# Patient Record
Sex: Female | Born: 1975 | Race: White | Marital: Married | Smoking: Never smoker
Health system: Southern US, Community
[De-identification: ages and names within clinical notes are randomized; demographics above are authoritative.]

## PROBLEM LIST (undated history)

## (undated) DIAGNOSIS — F32A Depression, unspecified: Secondary | ICD-10-CM

## (undated) DIAGNOSIS — F329 Major depressive disorder, single episode, unspecified: Secondary | ICD-10-CM

## (undated) DIAGNOSIS — E039 Hypothyroidism, unspecified: Secondary | ICD-10-CM

## (undated) HISTORY — PX: TONSILLECTOMY: SUR1361

## (undated) HISTORY — PX: MANDIBLE SURGERY: SHX707

## (undated) HISTORY — DX: Depression, unspecified: F32.A

## (undated) HISTORY — DX: Hypothyroidism, unspecified: E03.9

## (undated) HISTORY — DX: Major depressive disorder, single episode, unspecified: F32.9

---

## 2009-03-12 HISTORY — PX: LAPAROSCOPIC GASTRIC BANDING: SHX1100

## 2011-02-22 ENCOUNTER — Ambulatory Visit (INDEPENDENT_AMBULATORY_CARE_PROVIDER_SITE_OTHER): Payer: Managed Care, Other (non HMO) | Admitting: Family Medicine

## 2011-02-22 ENCOUNTER — Other Ambulatory Visit: Payer: Self-pay | Admitting: Family Medicine

## 2011-02-22 ENCOUNTER — Encounter: Payer: Self-pay | Admitting: Family Medicine

## 2011-02-22 ENCOUNTER — Other Ambulatory Visit (HOSPITAL_COMMUNITY)
Admission: RE | Admit: 2011-02-22 | Discharge: 2011-02-22 | Disposition: A | Discharge status: Home / Self Care | Payer: Managed Care, Other (non HMO) | Source: Ambulatory Visit | Attending: Family Medicine | Admitting: Family Medicine

## 2011-02-22 VITALS — BP 112/80 | HR 52 | Temp 98.7°F | Ht 63.0 in | Wt 153.5 lb

## 2011-02-22 DIAGNOSIS — Z136 Encounter for screening for cardiovascular disorders: Secondary | ICD-10-CM

## 2011-02-22 DIAGNOSIS — E039 Hypothyroidism, unspecified: Secondary | ICD-10-CM | POA: Insufficient documentation

## 2011-02-22 DIAGNOSIS — Z1231 Encounter for screening mammogram for malignant neoplasm of breast: Secondary | ICD-10-CM

## 2011-02-22 DIAGNOSIS — R5381 Other malaise: Secondary | ICD-10-CM

## 2011-02-22 DIAGNOSIS — F329 Major depressive disorder, single episode, unspecified: Secondary | ICD-10-CM | POA: Insufficient documentation

## 2011-02-22 DIAGNOSIS — Z0000 Encounter for general adult medical examination without abnormal findings: Secondary | ICD-10-CM

## 2011-02-22 DIAGNOSIS — R5383 Other fatigue: Secondary | ICD-10-CM | POA: Insufficient documentation

## 2011-02-22 DIAGNOSIS — Z01419 Encounter for gynecological examination (general) (routine) without abnormal findings: Secondary | ICD-10-CM | POA: Insufficient documentation

## 2011-02-22 DIAGNOSIS — Z1159 Encounter for screening for other viral diseases: Secondary | ICD-10-CM | POA: Insufficient documentation

## 2011-02-22 LAB — CBC WITH DIFFERENTIAL/PLATELET
Basophils Relative: 0.5 % (ref 0.0–3.0)
Eosinophils Relative: 2.4 % (ref 0.0–5.0)
HCT: 39.3 % (ref 36.0–46.0)
Lymphs Abs: 3.5 10*3/uL (ref 0.7–4.0)
MCV: 87.2 fl (ref 78.0–100.0)
Monocytes Absolute: 0.7 10*3/uL (ref 0.1–1.0)
Neutro Abs: 4.9 10*3/uL (ref 1.4–7.7)
RBC: 4.51 Mil/uL (ref 3.87–5.11)
WBC: 9.3 10*3/uL (ref 4.5–10.5)

## 2011-02-22 LAB — BASIC METABOLIC PANEL
Chloride: 103 mEq/L (ref 96–112)
Potassium: 3.8 mEq/L (ref 3.5–5.1)

## 2011-02-22 LAB — LIPID PANEL
LDL Cholesterol: 103 mg/dL — ABNORMAL HIGH (ref 0–99)
Total CHOL/HDL Ratio: 3

## 2011-02-22 LAB — VITAMIN B12: Vitamin B-12: 367 pg/mL (ref 211–911)

## 2011-02-22 NOTE — Progress Notes (Signed)
Subjective:    Patient ID: Paige Kirk, female    DOB: [birth year], 35 y.o.   MRN: [id number]  HPI  Very pleasant 35 yo female here to establish care and for CPX.  Recently moved back to [place] from [place].  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  G3P3- no h/o abnormal pap smears. Last past smear was a couple of years ago. Mom died of breast cancer at 49.  Pt has not had a mammogram in over 5 years. Has not noticed any breast masses or changes in her nipples.  Fatigue- the day they arrived here, son had a seizure for the first time, diagnosed with a brain tumor.  Has had two surgeries.  Obviously a stressful time for her but she wanted to make sure there was not more to her fatigue.  No CP or SOB.  Hair has been falling out more rapidly.  Hypothyroidism- has been on Synthroid for years, current dose for 3 years.  Has lost 70 pounds since her lap band surgery in 2011!  Feels great other than her fatigue.  Depression- Prozac 20 mg daily has been working well.  Although under stress, has supportive family. Not more tearful than expected. She is not having trouble sleeping- working nights. No SI or HI.  Patient Active Problem List  Diagnoses  . Hypothyroidism  . Fatigue  . Routine general medical examination at a health care facility  . Depression   Past Medical History  Diagnosis Date  . Hypothyroidism   . Depression    Past Surgical History  Procedure Date  . Laparoscopic gastric banding 2011  . Tonsillectomy   . Mandible surgery    History  Substance Use Topics  . Smoking status: Never Smoker   . Smokeless tobacco: Not on file  . Alcohol Use: Not on file   Family History  Problem Relation Age of Onset  . Cancer Mother   . Heart disease Father   . Hypertension Father    No Known Allergies No current outpatient prescriptions on file prior to visit.   The PMH, PSH, Social History, Family History, Medications, and allergies have been reviewed in [facility], and  have been updated if relevant.    Review of Systems See HPI Patient reports no  vision/ hearing changes,anorexia, weight change, fever ,adenopathy, persistant / recurrent hoarseness, swallowing issues, chest pain, edema,persistant / recurrent cough, hemoptysis, dyspnea(rest, exertional, paroxysmal nocturnal), gastrointestinal  bleeding (melena, rectal bleeding), abdominal pain, excessive heart burn, GU symptoms(dysuria, hematuria, pyuria, voiding/incontinence  Issues) syncope, focal weakness, severe memory loss, concerning skin lesions, depression, anxiety, abnormal bruising/bleeding, major joint swelling, breast masses or abnormal vaginal bleeding.       Objective:   Physical Exam BP 112/80  Pulse 52  Temp(Src) 98.7 F (37.1 C) (Oral)  Ht 5\' 3"  (1.6 m)  Wt 153 lb 8 oz (69.627 kg)  BMI 27.19 kg/m2  LMP 02/08/2011  General:  Well-developed,well-nourished,in no acute distress; alert,appropriate and cooperative throughout examination Head:  normocephalic and atraumatic.   Eyes:  vision grossly intact, pupils equal, pupils round, and pupils reactive to light.   Ears:  R ear normal and L ear normal.   Nose:  no external deformity.   Mouth:  good dentition.   Neck:  No deformities, masses, or tenderness noted. Breasts:  No mass, nodules, thickening, tenderness, bulging, retraction, inflamation, nipple discharge or skin changes noted.   Lungs:  Normal respiratory effort, chest expands symmetrically. Lungs are clear to auscultation, no crackles or wheezes. Heart:  Normal rate and regular rhythm. S1 and S2 normal without gallop, murmur, click, rub or other extra sounds. Abdomen:  Bowel sounds positive,abdomen soft and non-tender without masses, organomegaly or hernias noted. Rectal:  no external abnormalities.   Genitalia:  Pelvic Exam:        External: normal female genitalia without lesions or masses        Vagina: normal without lesions or masses        Cervix: normal without lesions or  masses        Adnexa: normal bimanual exam without masses or fullness        Uterus: normal by palpation        Pap smear: performed Msk:  No deformity or scoliosis noted of thoracic or lumbar spine.   Extremities:  No clubbing, cyanosis, edema, or deformity noted with normal full range of motion of all joints.   Neurologic:  alert & oriented X3 and gait normal.   Skin:  Intact without suspicious lesions or rashes, tattoo on lower back. Cervical Nodes:  No lymphadenopathy noted Axillary Nodes:  No palpable lymphadenopathy Psych:  Cognition and judgment appear intact. Alert and cooperative with normal attention span and concentration. No apparent delusions, illusions, hallucinations     Assessment & Plan:   1. Fatigue  Likely due to recent life changes and stressors- son's medical issues, working nights with recent move. Will check labs to rule out other contributing factors, particularly since she does have hypothyroidism and is s/p lap band which may affect her absorption of certain vit/min. The patient indicates understanding of these issues and agrees with the plan.  CBC w/Diff, B12, Vitamin D (25 hydroxy)  2. Routine general medical examination at a health care facility  Reviewed preventive care protocols, scheduled due services, and updated immunizations Discussed nutrition, exercise, diet, and healthy lifestyle.  Cytology -Pap Smear, Basic Metabolic Panel (BMET) MM Digital Screening Lipid Profile  3. Hypothyroidism  See #1. TSH, T4, free

## 2011-02-22 NOTE — Patient Instructions (Signed)
Wonderful to meet you. Have a wonderful christmas. Please stop by to see Shirlee Limerick after you go to the lab to set up your mammogram.

## 2011-02-23 ENCOUNTER — Telehealth: Payer: Self-pay | Admitting: *Deleted

## 2011-02-23 LAB — VITAMIN D 25 HYDROXY (VIT D DEFICIENCY, FRACTURES): Vit D, 25-Hydroxy: 26 ng/mL — ABNORMAL LOW (ref 30–89)

## 2011-02-23 MED ORDER — LEVOTHYROXINE SODIUM 112 MCG PO TABS: 112.0000 ug | ORAL_TABLET | Freq: Every day | ORAL | Status: DC

## 2011-02-23 MED ORDER — VITAMIN D3 1.25 MG (50000 UT) PO CAPS: 1.0000 | ORAL_CAPSULE | ORAL | Status: DC

## 2011-02-23 NOTE — Telephone Encounter (Signed)
Patient notified that Rx's were sent to [facility].

## 2011-03-01 ENCOUNTER — Encounter: Payer: Self-pay | Admitting: *Deleted

## 2011-04-12 ENCOUNTER — Ambulatory Visit: Payer: Self-pay | Admitting: Family Medicine

## 2011-04-18 ENCOUNTER — Encounter: Payer: Self-pay | Admitting: Family Medicine

## 2011-04-19 ENCOUNTER — Encounter: Payer: Self-pay | Admitting: *Deleted

## 2011-07-06 ENCOUNTER — Other Ambulatory Visit: Payer: Self-pay | Admitting: Family Medicine

## 2011-07-06 NOTE — Telephone Encounter (Signed)
Ok to refill the fluoxetine with 6 refills. Please call to ask pt what does of levothyroxine she is taking.

## 2011-07-06 NOTE — Telephone Encounter (Signed)
Our meds list says Levothyroxine 112 mcg.  Request is for 100 mcg.  Please advise.  Also request for Fluoxetine.  Electronic refill request.

## 2011-07-09 NOTE — Telephone Encounter (Signed)
Prozac sent to pharmacy.  Left message asking patient to call back regarding levothyroxine dose.

## 2011-07-10 NOTE — Telephone Encounter (Signed)
Spoke with patient, she says she doesn't use [facility], she uses [facility] and she has plenty of refills there.

## 2011-12-22 ENCOUNTER — Emergency Department: Payer: Self-pay | Admitting: Emergency Medicine

## 2011-12-22 LAB — URINALYSIS, COMPLETE
Bilirubin,UR: NEGATIVE
Blood: NEGATIVE
Glucose,UR: NEGATIVE mg/dL (ref 0–75)
Ketone: NEGATIVE
Leukocyte Esterase: NEGATIVE
Nitrite: NEGATIVE
Ph: 7 (ref 4.5–8.0)
Protein: NEGATIVE
RBC,UR: 2 /HPF (ref 0–5)
WBC UR: 1 /HPF (ref 0–5)

## 2011-12-22 LAB — CBC
HCT: 36.6 % (ref 35.0–47.0)
MCHC: 33.4 g/dL (ref 32.0–36.0)
MCV: 87 fL (ref 80–100)
Platelet: 269 10*3/uL (ref 150–440)
RBC: 4.21 10*6/uL (ref 3.80–5.20)
RDW: 14.1 % (ref 11.5–14.5)
WBC: 8.9 10*3/uL (ref 3.6–11.0)

## 2011-12-22 LAB — COMPREHENSIVE METABOLIC PANEL
Alkaline Phosphatase: 77 U/L (ref 50–136)
Anion Gap: 8 (ref 7–16)
Bilirubin,Total: 0.3 mg/dL (ref 0.2–1.0)
Chloride: 108 mmol/L — ABNORMAL HIGH (ref 98–107)
Co2: 27 mmol/L (ref 21–32)
Creatinine: 0.74 mg/dL (ref 0.60–1.30)
EGFR (African American): >60
EGFR (Non-African Amer.): >60
Osmolality: 284 (ref 275–301)
Potassium: 4.1 mmol/L (ref 3.5–5.1)
SGPT (ALT): 23 U/L (ref 12–78)
Sodium: 143 mmol/L (ref 136–145)

## 2012-01-10 ENCOUNTER — Ambulatory Visit: Payer: Self-pay | Admitting: Surgery

## 2012-03-27 ENCOUNTER — Encounter: Payer: Self-pay | Admitting: Family Medicine

## 2012-03-27 ENCOUNTER — Ambulatory Visit (INDEPENDENT_AMBULATORY_CARE_PROVIDER_SITE_OTHER): Payer: Managed Care, Other (non HMO) | Admitting: Family Medicine

## 2012-03-27 VITALS — BP 140/96 | HR 60 | Temp 98.0°F | Wt 150.0 lb

## 2012-03-27 DIAGNOSIS — R030 Elevated blood-pressure reading, without diagnosis of hypertension: Secondary | ICD-10-CM

## 2012-03-27 DIAGNOSIS — E039 Hypothyroidism, unspecified: Secondary | ICD-10-CM

## 2012-03-27 MED ORDER — FLUOXETINE HCL 20 MG PO CAPS: ORAL_CAPSULE | ORAL | Status: DC

## 2012-03-27 MED ORDER — LEVOTHYROXINE SODIUM 112 MCG PO TABS: 112.0000 ug | ORAL_TABLET | Freq: Every day | ORAL | Status: DC

## 2012-03-27 NOTE — Progress Notes (Signed)
Subjective:    Patient ID: Paige Kirk, female    DOB: 1975-09-27, 37 y.o.   MRN: [id number]  HPI  Very pleasant 37 yo female here with husband to discuss thyroid function.   Hypothyroidism- has been on Synthroid for years but stopped taking it two months ago because "she felt fine." Now very fatigued. Denies any changes in her bowel habits, hair or skin. No heat or cold intolerance.  ?HTN- her husband is concerned by her BP today.  States it was elevated at [facility] recently as well but he cannot remember reading. She did rush to get here and at a big breakfast.  With her h/o lap band, she gets abdominal discomfort when she does this which she is experiencing now.    Patient Active Problem List  Diagnosis  . Hypothyroidism  . Fatigue  . Routine general medical examination at a health care facility  . Depression   Past Medical History  Diagnosis Date  . Hypothyroidism   . Depression    Past Surgical History  Procedure Date  . Laparoscopic gastric banding 2011  . Tonsillectomy   . Mandible surgery    History  Substance Use Topics  . Smoking status: Never Smoker   . Smokeless tobacco: Not on file  . Alcohol Use: Not on file   Family History  Problem Relation Age of Onset  . Cancer Mother   . Heart disease Father   . Hypertension Father    No Known Allergies Current Outpatient Prescriptions on File Prior to Visit  Medication Sig Dispense Refill  . Cholecalciferol (VITAMIN D3) 50000 UNITS CAPS Take 1 capsule by mouth once a week.  6 capsule  0  . levothyroxine (SYNTHROID, LEVOTHROID) 112 MCG tablet Take 1 tablet (112 mcg total) by mouth daily.  30 tablet  3  . FLUoxetine (PROZAC) 20 MG capsule Take one by mouth daily.  90 capsule  0   The PMH, PSH, Social History, Family History, Medications, and allergies have been reviewed in [facility], and have been updated if relevant.    Review of Systems See HPI      Objective:   Physical Exam BP 140/96  Pulse 60  Temp  98 F (36.7 C)  Wt 150 lb (68.04 kg)  General:  Well-developed,well-nourished,in no acute distress; alert,appropriate and cooperative throughout examination Head:  normocephalic and atraumatic.   Eyes:  vision grossly intact, pupils equal, pupils round, and pupils reactive to light.   Ears:  R ear normal and L ear normal.   Nose:  no external deformity.   Mouth:  good dentition.   Neck:  No deformities, masses, or tenderness noted. General:  Well-developed,well-nourished,in no acute distress; alert,appropriate and cooperative throughout examination Head:  normocephalic and atraumatic.   Eyes:  vision grossly intact, pupils equal, pupils round, and pupils reactive to light.   Ears:  R ear normal and L ear normal.   Nose:  no external deformity.   Mouth:  good dentition.   Neck:  No deformities, masses, or tenderness noted. Lungs:  Normal respiratory effort, chest expands symmetrically. Lungs are clear to auscultation, no crackles or wheezes. Heart:  Normal rate and regular rhythm. S1 and S2 normal without gallop, murmur, click, rub or other extra sounds.     Assessment & Plan:   1. Hypothyroidism  TSH, T4, Free   Deteriorated.  Restart synthroid.  Recheck TSH/FT4 in 4 - 8 weeks. The patient indicates understanding of these issues and agrees with the plan.  2. Elevated blood pressure (not hypertension)     Bp normal at last office visit but that was over a year ago. BP Readings from Last 3 Encounters:  03/27/12 140/96  02/22/11 112/80   She is asymptomatic.  Advised getting home BP cuff, checking readings and calling me with results.  No rx. The patient indicates understanding of these issues and agrees with the plan.

## 2012-03-27 NOTE — Patient Instructions (Addendum)
Good to see you. If you are comfortable, buy a home blood pressure cuff--OMRON.  Please make an appointment to have your labs rechecked in 4-8 weeks.

## 2012-05-08 ENCOUNTER — Other Ambulatory Visit: Payer: Self-pay | Admitting: Family Medicine

## 2012-05-08 ENCOUNTER — Other Ambulatory Visit (INDEPENDENT_AMBULATORY_CARE_PROVIDER_SITE_OTHER): Payer: Managed Care, Other (non HMO)

## 2012-05-08 DIAGNOSIS — E039 Hypothyroidism, unspecified: Secondary | ICD-10-CM

## 2012-05-08 MED ORDER — LEVOTHYROXINE SODIUM 125 MCG PO TABS: 125.0000 ug | ORAL_TABLET | Freq: Every day | ORAL | Status: DC

## 2012-08-05 ENCOUNTER — Ambulatory Visit (INDEPENDENT_AMBULATORY_CARE_PROVIDER_SITE_OTHER): Payer: Managed Care, Other (non HMO) | Admitting: Family Medicine

## 2012-08-05 ENCOUNTER — Encounter: Payer: Self-pay | Admitting: Family Medicine

## 2012-08-05 VITALS — BP 120/72 | HR 54 | Temp 97.9°F | Ht 63.0 in | Wt 148.0 lb

## 2012-08-05 DIAGNOSIS — R197 Diarrhea, unspecified: Secondary | ICD-10-CM | POA: Insufficient documentation

## 2012-08-05 DIAGNOSIS — N760 Acute vaginitis: Secondary | ICD-10-CM

## 2012-08-05 LAB — BASIC METABOLIC PANEL
CO2: 28 mEq/L (ref 19–32)
Calcium: 9 mg/dL (ref 8.4–10.5)
Creatinine, Ser: 0.7 mg/dL (ref 0.4–1.2)
GFR: 95.96 mL/min (ref >60.00)
Sodium: 140 mEq/L (ref 135–145)

## 2012-08-05 MED ORDER — FLUCONAZOLE 150 MG PO TABS: 150.0000 mg | ORAL_TABLET | Freq: Once | ORAL | Status: DC

## 2012-08-05 NOTE — Progress Notes (Signed)
Subjective:    Patient ID: Paige Kirk, female    DOB: 06/23/1975, 37 y.o.   MRN: [id number]  HPI  37 yo healthy female here for:  1.  Diarrhea- ongoing for 4-6 weeks.  Started out with daily (sometimes only once a day), watery diarrhea.  Immodium helped.  Stopped taking the immodium a couple of weeks ago.  Now having watery BM every few days.  It is usually associated with abdominal cramping that is resolved by defacation.  No blood or mucous in stools.  No weight loss- she does have remove h/o lab band and cholecystectomy.  No fevers.    She has noted increased gassiness and bloating.  Has not noticed if any foods have made it worse.  Has not noticed if any foods have made it better or worse.  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  Legs have been cramping after tennis.  No family h/o IBD.  2.  Vaginal itching with white discharge- she has no risk factors for STDs and feels like yeast infection she has had in past.  Patient Active Problem List   Diagnosis Date Noted  . Diarrhea 08/05/2012  . Vaginitis and vulvovaginitis 08/05/2012  . Elevated blood pressure (not hypertension) 03/27/2012  . Hypothyroidism   . Depression    Past Medical History  Diagnosis Date  . Hypothyroidism   . Depression    Past Surgical History  Procedure Laterality Date  . Laparoscopic gastric banding  2011  . Tonsillectomy    . Mandible surgery     History  Substance Use Topics  . Smoking status: Never Smoker   . Smokeless tobacco: Not on file  . Alcohol Use: Not on file   Family History  Problem Relation Age of Onset  . Cancer Mother   . Heart disease Father   . Hypertension Father    No Known Allergies Current Outpatient Prescriptions on File Prior to Visit  Medication Sig Dispense Refill  . FLUoxetine (PROZAC) 20 MG capsule Take one by mouth daily.  90 capsule  0  . levothyroxine (SYNTHROID, LEVOTHROID) 125 MCG tablet Take 1 tablet (125 mcg total) by mouth daily.  30  tablet  3   No current facility-administered medications on file prior to visit.   The PMH, PSH, Social History, Family History, Medications, and allergies have been reviewed in [facility], and have been updated if relevant.  Review of Systems    See HPI Objective:   Physical Exam BP 120/72  Pulse 54  Temp(Src) 97.9 F (36.6 C) (Oral)  Ht 5\' 3"  (1.6 m)  Wt 148 lb (67.132 kg)  BMI 26.22 kg/m2  SpO2 99%  General:  Well-developed,well-nourished,in no acute distress; alert,appropriate and cooperative throughout examination Head:  normocephalic and atraumatic.   Abdomen:  Bowel sounds positive,abdomen soft and non-tender without masses, organomegaly or hernias noted. Left lower quadrant port palpable but non tender. Msk:  No deformity or scoliosis noted of thoracic or lumbar spine.   Extremities:  No clubbing, cyanosis, edema, or deformity noted with normal full range of motion of all joints.   Neurologic:  alert & oriented X3 and gait normal.   Skin:  Intact without suspicious lesions or rashes Psych:  Cognition and judgment appear intact. Alert and cooperative with normal attention span and concentration. No apparent delusions, illusions, hallucinations    Assessment & Plan:  1. Diarrhea New- seems to be improving in terms of frequency. 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  See AVS for instructions. - Stool culture - Clostridium difficile toxin - Basic Metabolic Panel  2. Vaginitis and vulvovaginitis Probable yeast infection.  Treat with diflucan 150 mg x 1.

## 2012-08-05 NOTE — Patient Instructions (Addendum)
  Please stop by the lab to get your stool sample kit.   pepcid 20 mg daily for next 2 weeks. Exclude gas producing foods (beans, onions, celery, carrots, raisins, bananas, apricots, prunes, brussel sprouts, wheat germ, pretzels)   Trial of align for bloating/IBS symptoms (OTC).  Take diflucan 150 mg by mouth x 1.

## 2012-08-10 LAB — STOOL CULTURE

## 2012-08-18 ENCOUNTER — Encounter: Payer: Self-pay | Admitting: *Deleted

## 2012-10-21 ENCOUNTER — Ambulatory Visit (INDEPENDENT_AMBULATORY_CARE_PROVIDER_SITE_OTHER): Payer: Managed Care, Other (non HMO) | Admitting: Family Medicine

## 2012-10-21 ENCOUNTER — Encounter: Payer: Self-pay | Admitting: Family Medicine

## 2012-10-21 VITALS — BP 100/70 | HR 60 | Temp 98.0°F | Ht 63.0 in | Wt 139.0 lb

## 2012-10-21 DIAGNOSIS — M7660 Achilles tendinitis, unspecified leg: Secondary | ICD-10-CM

## 2012-10-21 DIAGNOSIS — M7662 Achilles tendinitis, left leg: Secondary | ICD-10-CM

## 2012-10-21 NOTE — Progress Notes (Signed)
L heel pain.  Not getting better over the duration of symptoms. She hasn't stopped exercising.  She works at [employer]. More painful as the day goes on. Plays tennis. Going on for a few weeks.  No trauma. L achilles area is tender.  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  Takes ibuprofen occ, some help.  Alternates shoes at work.  At its worst, 5/10 pain.    Meds, vitals, and allergies reviewed.   ROS: See HPI.  Otherwise, noncontributory.  nad ncat L foot with normal inspection and DP pulse. Normal distal sensation.  L AT tender at the insertion but no failure on testing the calf.  Pain is improved in boot.

## 2012-10-21 NOTE — Patient Instructions (Signed)
Wear the boot as much as possible when weight bearing.  Recheck with Dr. Patsy Lager in about 2 weeks.  Get a heel lift in the meantime.  Take care.

## 2012-10-22 NOTE — Assessment & Plan Note (Signed)
L AT tender at the insertion but no failure on testing the calf.  Pain is improved in boot.  Likely quickest improvement with CAM boot, fitted in clinic.  Use as much as possible.  She'll f/u with Dr. Patsy Lager about future rehab of the AT before return to exercise.  She agrees.

## 2012-11-05 ENCOUNTER — Ambulatory Visit (INDEPENDENT_AMBULATORY_CARE_PROVIDER_SITE_OTHER): Payer: Managed Care, Other (non HMO) | Admitting: Family Medicine

## 2012-11-05 ENCOUNTER — Encounter: Payer: Self-pay | Admitting: Family Medicine

## 2012-11-05 VITALS — BP 112/80 | HR 61 | Temp 98.2°F | Ht 63.0 in | Wt 141.2 lb

## 2012-11-05 DIAGNOSIS — M7662 Achilles tendinitis, left leg: Secondary | ICD-10-CM

## 2012-11-05 DIAGNOSIS — M7660 Achilles tendinitis, unspecified leg: Secondary | ICD-10-CM

## 2012-11-05 NOTE — Patient Instructions (Addendum)
Achilles Rehab  Begin with easy walking, heel, toe and backwards  Calf raises on a step First lower and then raise on 1 foot If this is painful lower on 1 foot but do the heel raise on both feet  Begin with 3 sets of 10 repetitions  Increase by 5 repetitions every 3 days  Goal is 3 sets of 30 repetitions  Do with both knee straight and knee at 20 degrees of flexion  If pain persists at 3 sets of 30 - add backpack with 5 lbs Increase by 5 lbs per week to max of 30 lbs  

## 2012-11-05 NOTE — Progress Notes (Signed)
[facility] [address] [place] [address] Phone: [phone] Fax: [phone]  Date:  11/05/2012   Name:  Paige Kirk   DOB:  10-Aug-1975   MRN:  [id number] Gender: female Age: 37 y.o.  Primary Physician:  Ruthe Mannan, MD  Evaluating MD: Hannah Beat, MD   Chief Complaint: Foot Pain   History of Present Illness:  Paige Kirk is a 37 y.o. pleasant patient who presents with the following:  Achilles? Left: Saw Dr. Para March 2 weeks ago, and he felt she had L achilles tendinopathy and placed her in a CAM walker boot and d/c her physical activity. Now she is feeling quite a bit better. 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  10/22/2012 OV L heel pain.  Not getting better over the duration of symptoms. She hasn't stopped exercising.  She works at [employer]. More painful as the day goes on. Plays tennis. Going on for a few weeks.  No trauma. L achilles area is tender.  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  Takes ibuprofen occ, some help.  Alternates shoes at work.  At its worst, 5/10 pain.    Meds, vitals, and allergies reviewed.   ROS: See HPI.  Otherwise, noncontributory.  nad ncat L foot with normal inspection and DP pulse. Normal distal sensation.   L AT tender at the insertion but no failure on testing the calf.  Pain is improved in boot.     Patient Active Problem List   Diagnosis Date Noted  . Achilles tendonitis 10/21/2012  . Diarrhea 08/05/2012  . Vaginitis and vulvovaginitis 08/05/2012  . Elevated blood pressure (not hypertension) 03/27/2012  . Hypothyroidism   . Depression     Past Medical History  Diagnosis Date  . Hypothyroidism   . Depression     Past Surgical History  Procedure Laterality Date  . Laparoscopic gastric banding  2011  . Tonsillectomy    . Mandible surgery      History   Social History  . Marital Status: Married    Spouse Name: N/A    Number of Children: 3  . Years of Education:  N/A   Occupational History  . Respiratory therapist    Social History Main Topics  . Smoking status: Never Smoker   . Smokeless tobacco: Not on file  . Alcohol Use: No  . Drug Use: Not on file  . Sexual Activity: Not on file   Other Topics Concern  . Not on file   Social History Narrative  . No narrative on file    Family History  Problem Relation Age of Onset  . Cancer Mother   . Heart disease Father   . Hypertension Father     No Known Allergies  Medication list has been reviewed and updated.  Outpatient Prescriptions Prior to Visit  Medication Sig Dispense Refill  . famotidine (PEPCID) 10 MG tablet Take 10 mg by mouth 2 (two) times daily.      Marland Kitchen FLUoxetine (PROZAC) 20 MG capsule Take one by mouth daily.  90 capsule  0  . levothyroxine (SYNTHROID, LEVOTHROID) 125 MCG tablet Take 1 tablet (125 mcg total) by mouth daily.  30 tablet  3  . Probiotic Product (ALIGN PO) Take on by mouth daily       No facility-administered medications prior to visit.    Review of Systems:   GEN: No fevers, chills. Nontoxic. Primarily MSK c/o today. MSK: Detailed  in the HPI GI: tolerating PO intake without difficulty Neuro: No numbness, parasthesias, or tingling associated. Otherwise the pertinent positives of the ROS are noted above.    Physical Examination: BP 112/80  Pulse 61  Temp(Src) 98.2 F (36.8 C) (Oral)  Ht 5\' 3"  (1.6 m)  Wt 141 lb 4 oz (64.071 kg)  BMI 25.03 kg/m2  LMP 10/15/2012  Ideal Body Weight: Weight in (lb) to have BMI = 25: 140.8   GEN: Well-developed,well-nourished,in no acute distress; alert,appropriate and cooperative throughout examination HEENT: Normocephalic and atraumatic without obvious abnormalities. Ears, externally no deformities PULM: Breathing comfortably in no respiratory distress EXT: No clubbing, cyanosis, or edema PSYCH: Normally interactive. Cooperative during the interview. Pleasant. Friendly and conversant. Not anxious or depressed  appearing. Normal, full affect.  Foot: L Echymosis: no Edema: no ROM: full LE B Gait: heel toe, non-antalgic MT pain: no Callus pattern: none Lateral Mall: NT Medial Mall: NT Talus: NT Navicular: NT Cuboid: NT Calcaneous: NT Metatarsals: NT 5th MT: NT Phalanges: NT Achilles: PAINFUL TO PALPATE AT INSERTION ON LEFT, SMALL NODULE - but extremely small THOMPSON'S YIELDS PLANTARFLEXION Plantar Fascia: NT Fat Pad: NT Peroneals: NT Post Tib: NT Great Toe: Nml motion Ant Drawer: neg ATFL: NT CFL: NT Deltoid: NT Sensation: intact Cavus foot   Assessment and Plan:  Achilles tendonitis, left  >25 minutes spent in face to face time with patient, >50% spent in counselling or coordination of care  D/c CAM minimal use over next 3 days.  Given sports insoles.  Wear tennis shoes for now Suggested Tuli's heel cups  Reviewed Alfredsson's protocol. Refer to the patient instructions sections for details of plan shared with patient.   Orders Today:  No orders of the defined types were placed in this encounter.    Updated Medication List: (Includes new medications, updates to list, dose adjustments) Meds ordered this encounter  Medications  . ibuprofen (ADVIL) 200 MG tablet    Sig: Take 200 mg by mouth as needed for pain.    Medications Discontinued: There are no discontinued medications.    Signed, Elpidio Galea. Turner Baillie, MD 11/05/2012 9:15 AM

## 2013-01-15 ENCOUNTER — Other Ambulatory Visit: Payer: Self-pay

## 2013-04-21 ENCOUNTER — Encounter: Payer: Self-pay | Admitting: Family Medicine

## 2013-04-21 ENCOUNTER — Ambulatory Visit (INDEPENDENT_AMBULATORY_CARE_PROVIDER_SITE_OTHER): Payer: Managed Care, Other (non HMO) | Admitting: Family Medicine

## 2013-04-21 VITALS — BP 110/66 | HR 51 | Temp 98.0°F | Ht 63.5 in | Wt 149.0 lb

## 2013-04-21 DIAGNOSIS — F32A Depression, unspecified: Secondary | ICD-10-CM

## 2013-04-21 DIAGNOSIS — R030 Elevated blood-pressure reading, without diagnosis of hypertension: Secondary | ICD-10-CM

## 2013-04-21 DIAGNOSIS — F329 Major depressive disorder, single episode, unspecified: Secondary | ICD-10-CM

## 2013-04-21 DIAGNOSIS — F3289 Other specified depressive episodes: Secondary | ICD-10-CM

## 2013-04-21 DIAGNOSIS — E039 Hypothyroidism, unspecified: Secondary | ICD-10-CM

## 2013-04-21 MED ORDER — LEVOTHYROXINE SODIUM 125 MCG PO TABS: 125.0000 ug | ORAL_TABLET | Freq: Every day | ORAL | Status: AC

## 2013-04-21 MED ORDER — FLUOXETINE HCL 20 MG PO CAPS: ORAL_CAPSULE | ORAL | Status: AC

## 2013-04-21 NOTE — Progress Notes (Signed)
Subjective:    Patient ID: Paige Kirk, female    DOB: Jul 20, 1975, 38 y.o.   MRN: [id number]  HPI  Very pleasant 38 yo female here for med refills.  Hypothyroidism- was supposed to be taking synthroid 125 mcg daily.  Has not been taking it in at least a month.   Son had third brain tumor resection recently and she just stopped taking care of herself to focus on him.  Has noticed fatigue.  Hair is also falling out.     Denies any changes in her bowel habits. No heat or cold intolerance.  Overdue for labs.  Lab Results  Component Value Date   TSH 21.19* 05/08/2012    Depression- taking Prozac 20 mg daily.  Feels this works well when she takes it but has ran out a month ago.  She has been exercising and has great family support.  Denies feeling depressed or anxious.    Patient Active Problem List   Diagnosis Date Noted  . Hypothyroidism     Priority: High  . Depression     Priority: High  . Elevated blood pressure (not hypertension) 03/27/2012   Past Medical History  Diagnosis Date  . Hypothyroidism   . Depression    Past Surgical History  Procedure Laterality Date  . Laparoscopic gastric banding  2011  . Tonsillectomy    . Mandible surgery     History  Substance Use Topics  . Smoking status: Never Smoker   . Smokeless tobacco: Not on file  . Alcohol Use: No   Family History  Problem Relation Age of Onset  . Cancer Mother   . Heart disease Father   . Hypertension Father    No Known Allergies Current Outpatient Prescriptions on File Prior to Visit  Medication Sig Dispense Refill  . famotidine (PEPCID) 10 MG tablet Take 10 mg by mouth 2 (two) times daily.      Marland Kitchen ibuprofen (ADVIL) 200 MG tablet Take 200 mg by mouth as needed for pain.       No current facility-administered medications on file prior to visit.   The PMH, PSH, Social History, Family History, Medications, and allergies have been reviewed in [facility], and have been updated if relevant.    Review  of Systems See HPI  No SI or HI     Objective:   Physical Exam BP 110/66  Pulse 51  Temp(Src) 98 F (36.7 C) (Oral)  Ht 5' 3.5" (1.613 m)  Wt 149 lb (67.586 kg)  BMI 25.98 kg/m2  SpO2 99%  LMP 04/07/2013  General:  Well-developed,well-nourished,in no acute distress; alert,appropriate and cooperative throughout examination Head:  normocephalic and atraumatic.   Eyes:  vision grossly intact, pupils equal, pupils round, and pupils reactive to light.   Ears:  R ear normal and L ear normal.   Nose:  no external deformity.   Mouth:  good dentition.   Neck:  No deformities, masses, or tenderness noted. General:  Well-developed,well-nourished,in no acute distress; alert,appropriate and cooperative throughout examination Head:  normocephalic and atraumatic.   Eyes:  vision grossly intact, pupils equal, pupils round, and pupils reactive to light.   Ears:  R ear normal and L ear normal.   Nose:  no external deformity.   Mouth:  good dentition.   Neck:  No deformities, masses, or tenderness noted. Lungs:  Normal respiratory effort, chest expands symmetrically. Lungs are clear to auscultation, no crackles or wheezes. Heart:  Normal rate and regular rhythm. S1 and S2  normal without gallop, murmur, click, rub or other extra sounds.     Assessment & Plan:   Hypothyroidism  Elevated blood pressure (not hypertension)  Depression Deteriorated.  Restart synthroid.  Recheck TSH/FT4 in 4 - 8 weeks. The patient indicates understanding of these issues and agrees with the plan.

## 2013-04-21 NOTE — Assessment & Plan Note (Signed)
Deteriorated because she stopped taking her replacement therapy.  Rx refilled- she will restart this and return in 4 weeks to check labs. Orders Placed This Encounter  Procedures  . TSH  . T4, Free  . Basic metabolic panel

## 2013-04-21 NOTE — Assessment & Plan Note (Signed)
Rx refilled. Call or return to clinic prn if these symptoms worsen or fail to improve as anticipated. The patient indicates understanding of these issues and agrees with the plan.

## 2013-04-21 NOTE — Patient Instructions (Signed)
Hang in there. Please come back in 4 weeks to get your thyroid labs rechecked.

## 2013-04-21 NOTE — Progress Notes (Signed)
Pre-visit discussion using our clinic review tool. No additional management support is needed unless otherwise documented below in the visit note.  

## 2013-05-20 ENCOUNTER — Other Ambulatory Visit: Payer: Managed Care, Other (non HMO)

## 2013-11-27 IMAGING — US ABDOMEN ULTRASOUND LIMITED
1 series · 14 of 25 positions shown · non-contrast
Comparison: none

REASON FOR EXAM: EPIGASTRIC PAIN & VOMITING
COMMENTS:   Body Site: GB and Fossa, CBD, Head of Pancreas

PROCEDURE:     US  - US ABDOMEN LIMITED SURVEY  - December 22, 2011  [DATE]
RESULT:     Comparison: None.
TECHNIQUE: Multiple grayscale and color Doppler images were obtained the
right upper quadrant.

[Series 1: abdomen ultrasound limited · 0.26mm/px · 14 of 27 slices shown]
[im 1/27]
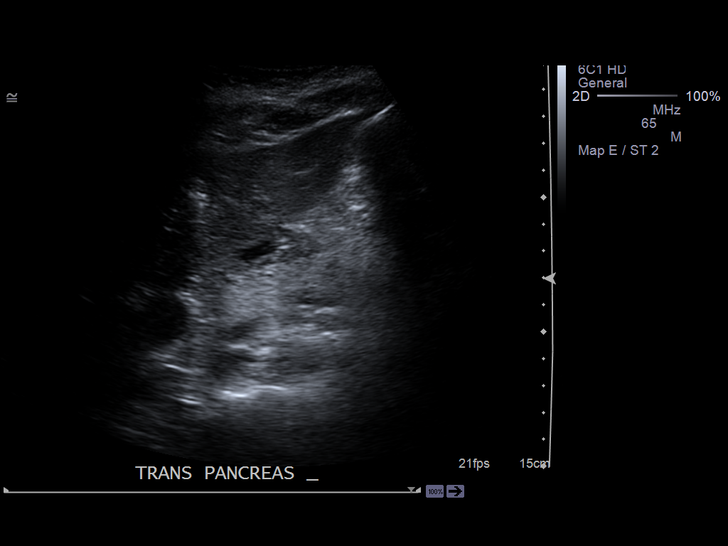
[im 3/27]
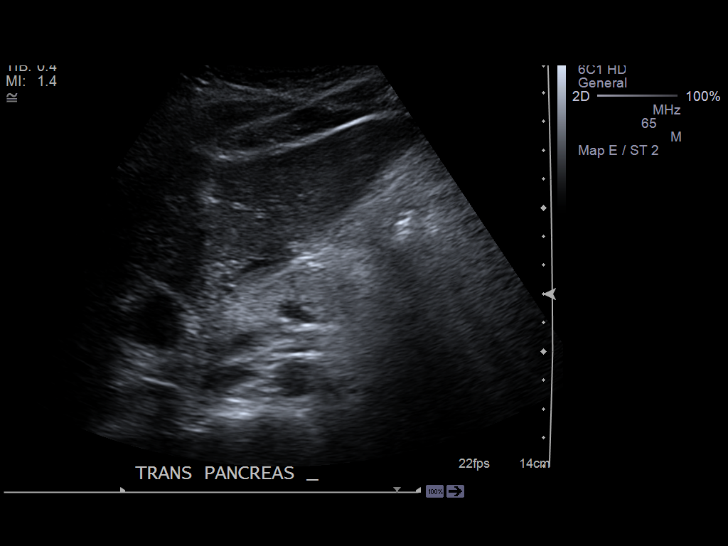
[im 5/27]
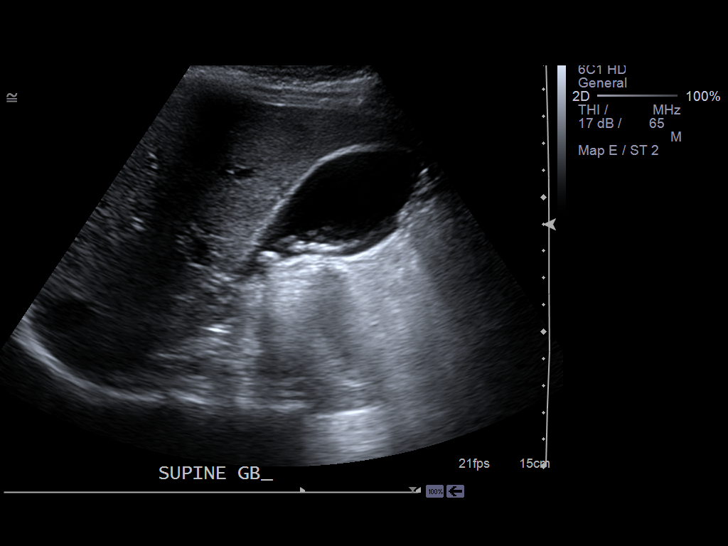
[im 7/27]
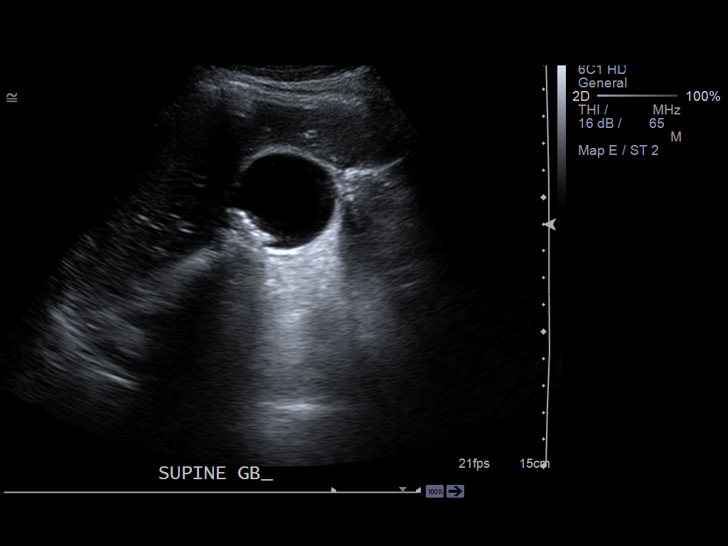
[im 9/27]
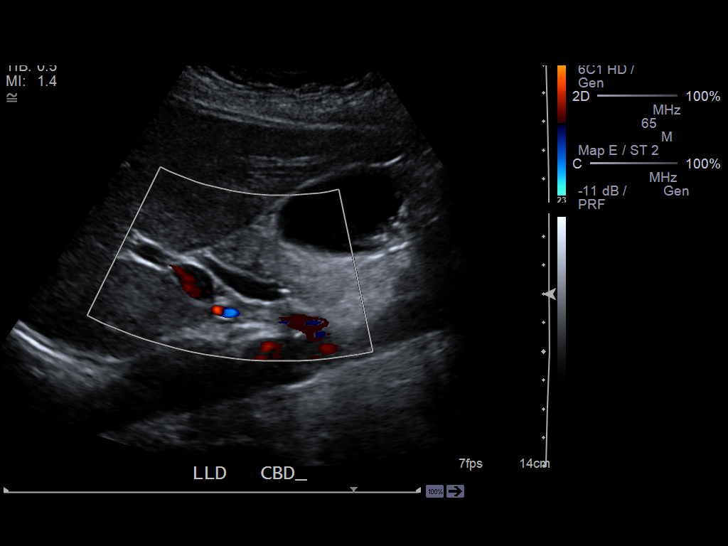
[im 10/27]
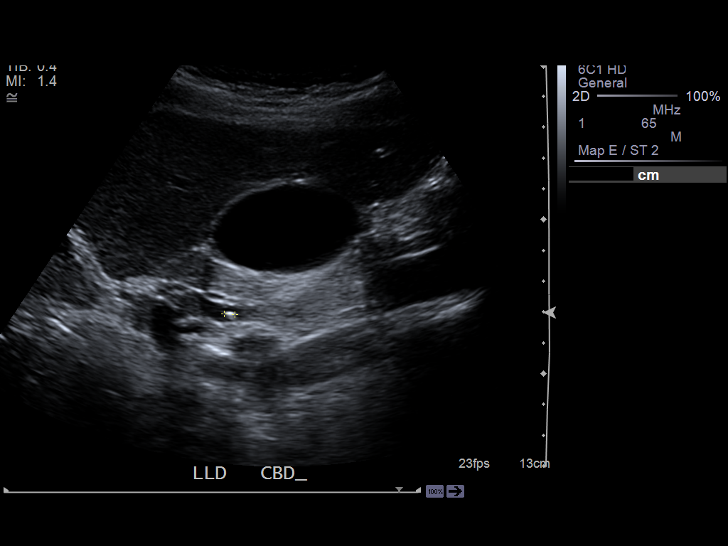
[im 12/27]
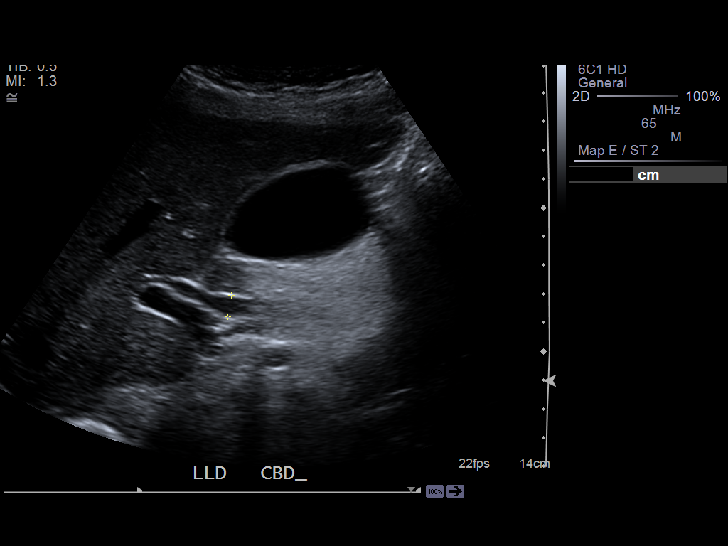
[im 15/27]
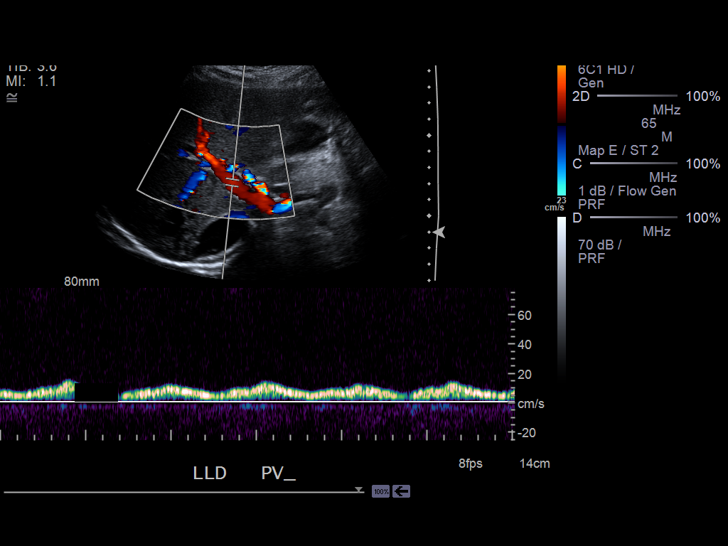
[im 17/27]
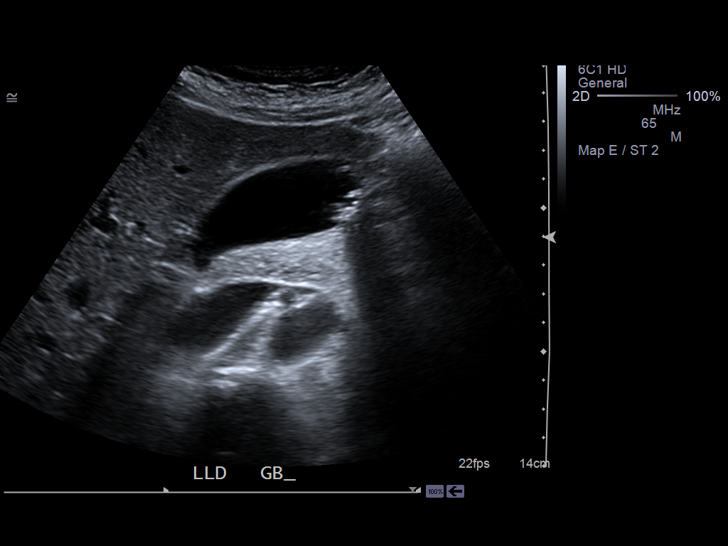
[im 18/27]
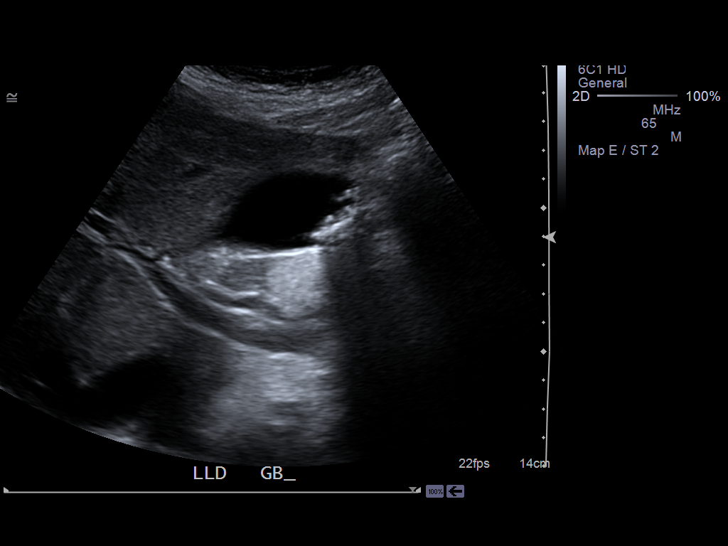
[im 20/27]
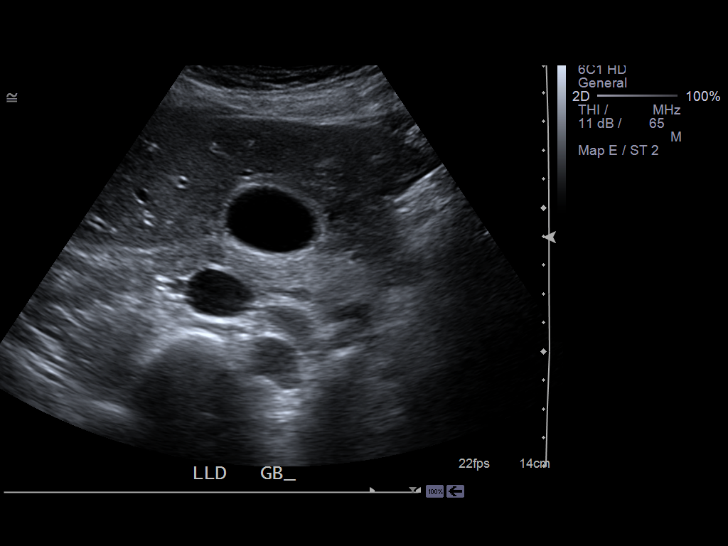
[im 22/27]
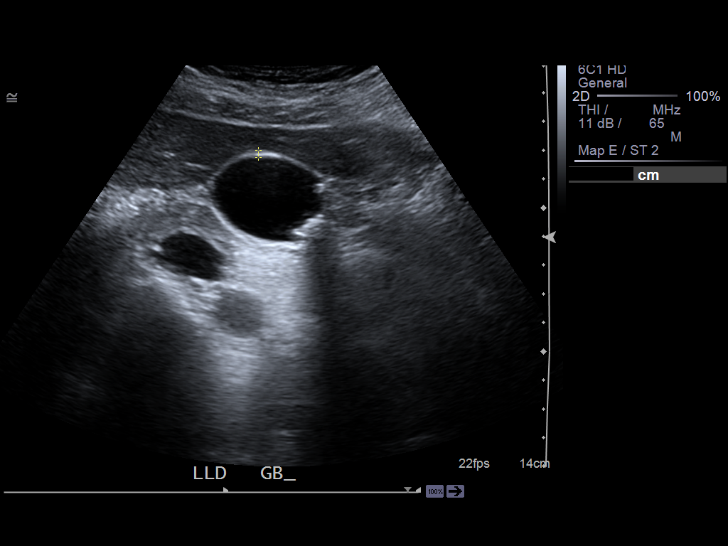
[im 24/27]
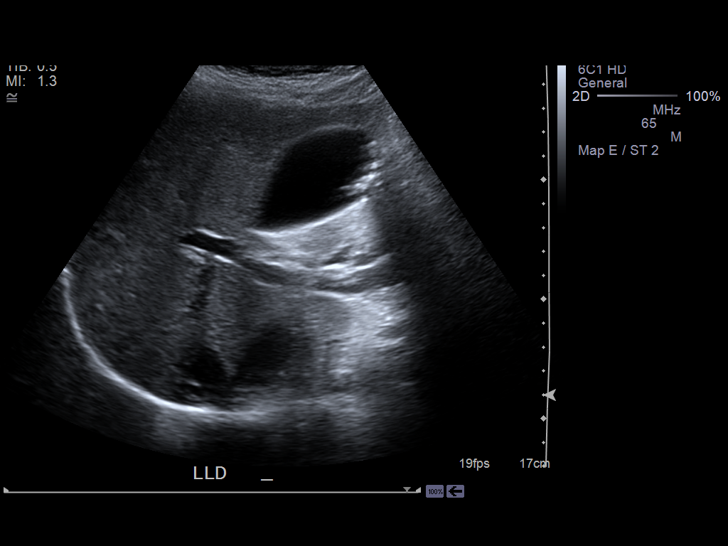
[im 27/27]
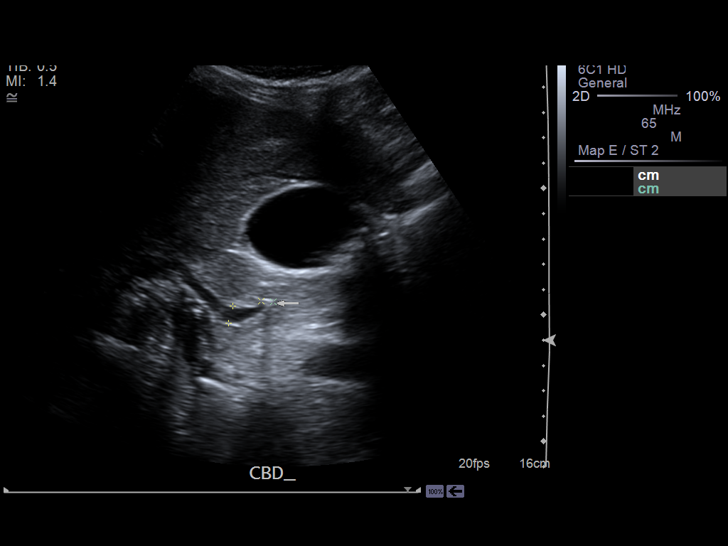

[14 of 25 positions shown; findings below may reference images not displayed]

FINDINGS: There are multiple stones in the gallbladder. The sonographic Murphy sign
was negative. No gallbladder wall thickening or pericholecystic fluid. The
common bile duct is mildly dilated, measuring 7-8 mm in diameter. There is
at least one round hyperechoic focus within the common bile duct which
measures 4 mm in diameter, and likely represents a stone within the common
bile duct.

The pancreas was mostly obscured by overlying bowel gas.
IMPRESSION: 1. Cholelithiasis, without sonographic evidence of acute cholecystitis.
2. The common bile duct is mildly dilated and there is at least one round
hyperechoic focus within the bile duct which likely represents
choledocholithiasis.

## 2013-12-16 IMAGING — CR DG CHOLANGIOGRAM OPERATIVE
1 series · 1 of 1 positions shown · non-contrast
Comparison: none

REASON FOR EXAM: cholelithiasis
COMMENTS:

[1]
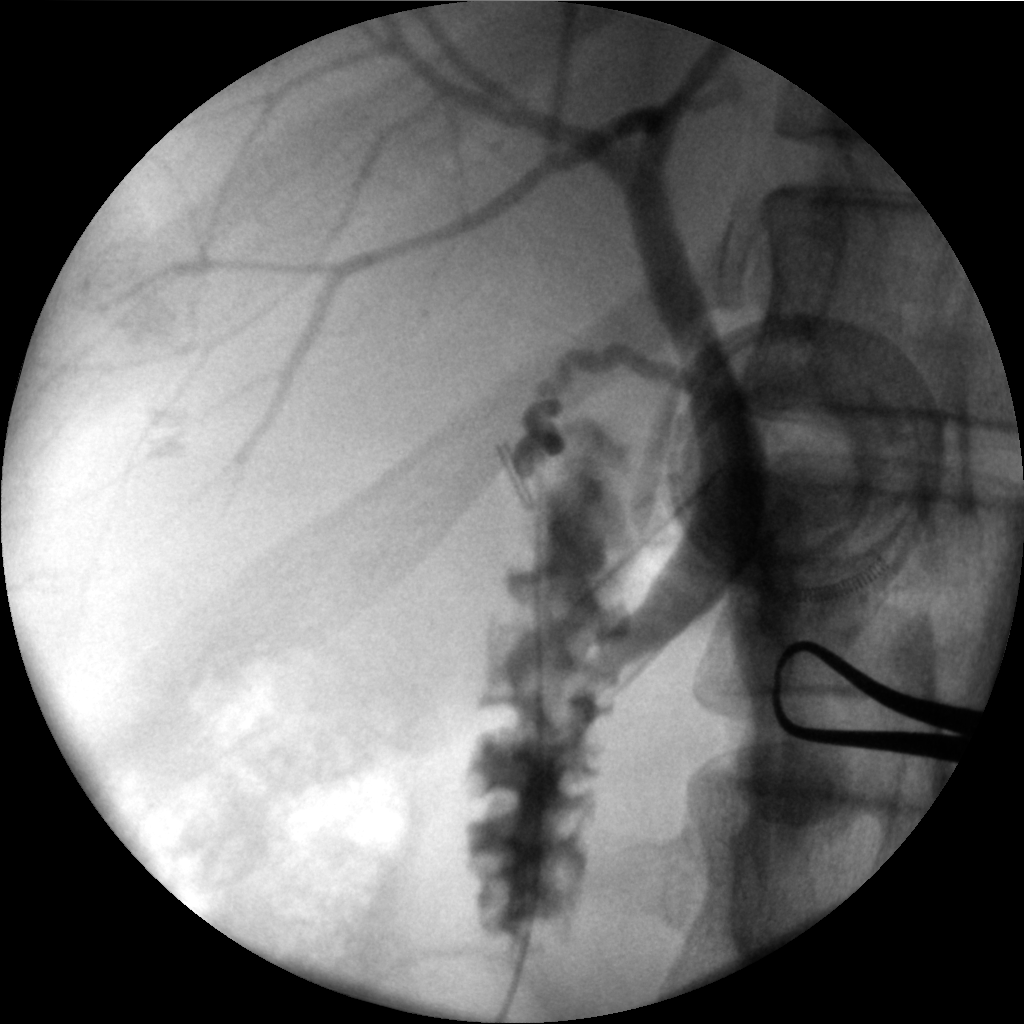

[1 of 1 positions shown; findings below may reference images not displayed]

PROCEDURE:     DXR - DXR CHOLANGIOGRAM OP (INITIAL)  - January 10, 2012  [DATE]

RESULT:     Single digital image from C-arm intraoperative cholangiogram
shows opacification of the cystic duct remnant, left and right hepatic
ducts, common hepatic duct and common bile duct with contrast flowing into
the small bowel. No filling defect or obstruction is evident.
IMPRESSION: Please see above.

[REDACTED]

## 2014-06-29 NOTE — Consult Note (Signed)
Brief Consult Note: Diagnosis: biliary colic.   Patient was seen by consultant.   Consult note dictated.   Recommend further assessment or treatment.   Discussed with Attending MD.   Comments: pain resolved, no lab evidence of choledocholithiasis will see as outpt, analgesic given as needed.  Electronic Signatures: Lattie Hawooper, Aphrodite Harpenau E (MD)  (Signed 12-Oct-13 09:16)  Authored: Brief Consult Note   Last Updated: 12-Oct-13 09:16 by Lattie Hawooper, Kona Lover E (MD)

## 2014-06-29 NOTE — Op Note (Signed)
PATIENT NAME:  Paige Kirk, Paige Kirk MR#:  [id number] DATE OF BIRTH:  1975-03-27  DATE OF PROCEDURE:  01/10/2012  PREOPERATIVE DIAGNOSIS: Symptomatic cholelithiasis.  POSTOPERATIVE DIAGNOSIS: Symptomatic cholelithiasis.   PROCEDURE PERFORMED: Laparoscopic cholecystectomy with cholangiogram.   SURGEON: Kailie Polus A. Bennette Hasty, MD   ANESTHESIA: General.   ESTIMATED BLOOD LOSS: 15 mL.   SPECIMEN: Gallbladder.   COMPLICATIONS: None.   INDICATIONS FOR SURGERY: Paige Kirk is a pleasant 39 year old female who presents with recurrent right upper quadrant pain and gallstones. I thoought that she would benefit and was a good candidate for a laparoscopic cholecystectomy.   DETAILS OF PROCEDURE: Informed consent was obtained in the office. On the day of surgery, Paige Kirk was brought to the Operating Room suite. She was laid supine on the Operating Room table. She was induced, an endotracheal tube was placed, and general anesthesia was administered. Her abdomen was then prepped and draped in standard surgical fashion. A time out was then performed correctly identifying the patient's name, operative site, and procedure to be performed. An infraumbilical incision was made to avoid her previous incisions from her gastric band placement and to avoid her band port which was in her left periumbilical region. This was deepened into the subcutaneous tissue.  The fascia was grasped and the fascia was incised. The posterior sheath was grasped as well and incised. A finger was inserted into the abdomen and there were no adhesions. Stay sutures of 0 Vicryl were placed and a Hassan trocar placed in the abdomen. The abdomen was insufflated. There were minimal adhesions.  I did interact with the tube that went to the port. I did identify it as well as looked at the band, which was unremarkable. A supraumbilical 11 mm port was placed under direct visualization, and then two 5 mm ports were placed approximately 2 cm below the right  costal margin on the midclavicular and anterior axillary line. The gallbladder fundus was grasped with the instrument placed through the lateralmost port. The fundus of the gallbladder was retracted over the dome of the liver. The gallbladder infundibulum was grasped and pulled laterally. The cystic artery and cystic duct were dissected out and the critical view was obtained. I then placed a clip on the gallbladder side at the cystic duct and incised the duct. Through an angiocatheter I placed a cholangiogram catheter into the duct and performed a cholangiogram. There was a long torduous cystic duct and what appeared to me a dilated common bile duct which filled easily proximally and distally with no obvious filling defects and through which contrast entered easily into the duodenum. The cholangiogram catheter was then taken out. The duct was then clipped twice away from the gallbladder and ligated. The cystic artery which was previously dissected out was clipped 3 times and ligated. The gallbladder was then taken off the gallbladder fossa  with electrocautery. The gallbladder was then taken out in an EndoCatch bag through the umbilical port. I then used a liter of saline to irrigate the abdomen and got rid of any residual bile and blood. The gallbladder fossa was then cauterized to provide hemostasis. Once hemostasis was obtained, all the trocars were taken out under direct visualization. The infraumbilical fascia was closed using a figure-of-eight 0 Vicryl suture. All port sites were then closed using deep dermal 4-0 Monocryl sutures. Dermabond was then placed over the wounds, and Paige Kirk was then awoken, extubated, and brought to the postanesthesia care unit. There were no immediate complications. Needle, sponge, and instrument counts were  correct at the end of the procedure.  ____________________________ Si Raider. Kham Zuckerman, MD cal:cbb D: 01/10/2012 09:33:21 ET T: 01/10/2012 10:05:20  ET JOB#: [id number]  cc: Cristal Deer A. Arnaldo Heffron, MD, <Dictator> Jarvis Newcomer MD ELECTRONICALLY SIGNED 01/13/2012 9:23

## 2014-06-29 NOTE — Consult Note (Signed)
PATIENT NAME:  Paige Kirk, Paige Kirk MR#:  [id number] DATE OF BIRTH:  05/29/1975  DATE OF CONSULTATION:  12/21/2011  REFERRING PHYSICIAN:   CONSULTING PHYSICIAN:  Adah Salvageichard E. Excell Seltzerooper, MD  DIAGNOSES:  1. Abdominal pain. 2. Morbid obesity. 3. Hypothyroidism. 4. Cholelithiasis. 5. Biliary colic.   PAST SURGICAL HISTORY: Lap band procedure.   ALLERGIES: None.   MEDICATIONS: Synthroid.   FAMILY HISTORY: Noncontributory.   SOCIAL HISTORY: The patient is a respiratory therapist at [employer]. She does not smoke or drink. She lost 80 pounds with her lap band.   REVIEW OF SYSTEMS: Ten system review is performed and negative with the exception of that mentioned in the HPI.   PHYSICAL EXAMINATION:  GENERAL: Healthy-appearing and comfortable-appearing Caucasian female patient. She is dressed and sitting at the bedside with her husband, having no pain whatsoever at this time.   VITAL SIGNS: Stable.   HEENT: No scleral icterus.   ABDOMEN: Soft and completely nontender. There is a palpable port in the left lower quadrant near the umbilicus with scar form lap band. No jaundice, no icterus, nontender calves.  LABORATORY VALUES: Completely normal. No sign of choledocholithiasis. AST and ALT are completely normal.   An ultrasound was obtained which shows gallstones and a dilated bile duct, no sign of acute cholecystitis, but suggestions of choledocholithiasis on ultrasound only.   ASSESSMENT AND PLAN: This is a patient who has recurrent biliary colic. This is her second episode, and her pain is completely resolved. I offered admission to the hospital and surgery in the next 24 to 48 hours based on weekend schedule; however, the patient would rather schedule this as an outpatient since her pain is completely gone. A question of choledocholithiasis on ultrasound with normal labs was discussed, the fact that she has normal LFTs, which is a much more sensitive test than the slightly dilated bile duct  with possible stone present in the bile duct was reviewed. Her pain is completely resolved at this time, and I feel comfortable sending her home with oral analgesics per Dr. Darnelle CatalanMalinda, and she will follow up in my office this week for scheduling. She is instructed to return if she were to worsen at any time.   ____________________________ Adah Salvageichard E. Excell Seltzerooper, MD rec:vtd D: 12/22/2011 09:23:22 ET T: 12/22/2011 09:32:55 ET JOB#: [id number]  cc: Adah Salvageichard E. Excell Seltzerooper, MD, <Dictator> Lattie HawICHARD E Dinita Migliaccio MD ELECTRONICALLY SIGNED 12/22/2011 11:09

## 2018-07-24 ENCOUNTER — Telehealth: Payer: Self-pay | Admitting: Family Medicine

## 2018-07-24 NOTE — Telephone Encounter (Signed)
Called to see if pt is still seeing Dr Dayton Martes as PCP since it has been a while, if not we need to update profile and if she is we need to schedule an appt
